# Patient Record
Sex: Male | Born: 1935 | Race: Black or African American | Hispanic: No | Marital: Single
Health system: Southern US, Community
[De-identification: ages and names within clinical notes are randomized; demographics above are authoritative.]

---

## 2005-03-30 ENCOUNTER — Emergency Department: Payer: Self-pay | Admitting: Emergency Medicine

## 2005-12-21 ENCOUNTER — Emergency Department: Payer: Self-pay | Admitting: Emergency Medicine

## 2006-11-23 ENCOUNTER — Emergency Department: Payer: Self-pay | Admitting: Emergency Medicine

## 2006-12-04 ENCOUNTER — Emergency Department: Payer: Self-pay | Admitting: Emergency Medicine

## 2007-05-21 ENCOUNTER — Emergency Department: Payer: Self-pay | Admitting: Emergency Medicine

## 2007-05-27 ENCOUNTER — Emergency Department: Payer: Self-pay | Admitting: Emergency Medicine

## 2008-09-25 ENCOUNTER — Emergency Department: Payer: Self-pay | Admitting: Emergency Medicine

## 2010-01-13 ENCOUNTER — Emergency Department: Payer: Self-pay | Admitting: Emergency Medicine

## 2010-07-02 ENCOUNTER — Emergency Department: Payer: Self-pay | Admitting: Emergency Medicine

## 2010-07-09 ENCOUNTER — Emergency Department: Payer: Self-pay | Admitting: Emergency Medicine

## 2010-12-12 ENCOUNTER — Inpatient Hospital Stay: Payer: Self-pay | Admitting: Internal Medicine

## 2010-12-13 ENCOUNTER — Ambulatory Visit: Payer: Self-pay | Admitting: Urology

## 2011-05-28 ENCOUNTER — Emergency Department: Payer: Self-pay | Admitting: Internal Medicine

## 2011-05-28 LAB — CBC
HGB: 9.8 g/dL — ABNORMAL LOW (ref 13.0–18.0)
MCH: 28.3 pg (ref 26.0–34.0)
MCHC: 32.8 g/dL (ref 32.0–36.0)
RBC: 3.45 10*6/uL — ABNORMAL LOW (ref 4.40–5.90)

## 2011-05-28 LAB — COMPREHENSIVE METABOLIC PANEL
Albumin: 3.2 g/dL — ABNORMAL LOW (ref 3.4–5.0)
Alkaline Phosphatase: 76 U/L (ref 50–136)
BUN: 45 mg/dL — ABNORMAL HIGH (ref 7–18)
Calcium, Total: 8.7 mg/dL (ref 8.5–10.1)
Co2: 21 mmol/L (ref 21–32)
EGFR (African American): 26 — ABNORMAL LOW
EGFR (Non-African Amer.): 21 — ABNORMAL LOW
Glucose: 95 mg/dL (ref 65–99)
Osmolality: 287 (ref 275–301)
SGOT(AST): <3 U/L (ref 15–37)
SGPT (ALT): 17 U/L
Sodium: 138 mmol/L (ref 136–145)

## 2011-05-28 LAB — URINALYSIS, COMPLETE
Glucose,UR: NEGATIVE mg/dL (ref 0–75)
Nitrite: NEGATIVE
Ph: 5 (ref 4.5–8.0)
WBC UR: 1796 /HPF (ref 0–5)

## 2011-06-01 LAB — URINE CULTURE

## 2011-06-03 LAB — CULTURE, BLOOD (SINGLE)

## 2011-07-04 ENCOUNTER — Emergency Department: Payer: Self-pay | Admitting: Emergency Medicine

## 2011-07-29 ENCOUNTER — Emergency Department: Payer: Self-pay | Admitting: *Deleted

## 2011-08-08 ENCOUNTER — Emergency Department: Payer: Self-pay | Admitting: Emergency Medicine

## 2011-09-24 ENCOUNTER — Emergency Department: Payer: Self-pay | Admitting: Emergency Medicine

## 2011-10-17 ENCOUNTER — Emergency Department: Payer: Self-pay | Admitting: Emergency Medicine

## 2011-10-18 LAB — URINALYSIS, COMPLETE
Bilirubin,UR: NEGATIVE
Glucose,UR: NEGATIVE mg/dL
Ketone: NEGATIVE
Nitrite: NEGATIVE
Ph: 6
Protein: 30
RBC,UR: 32 /HPF
Specific Gravity: 1.011
Squamous Epithelial: <1
WBC UR: 43 /HPF

## 2011-10-19 LAB — URINE CULTURE

## 2011-11-09 ENCOUNTER — Emergency Department: Payer: Self-pay | Admitting: Emergency Medicine

## 2011-11-09 LAB — URINALYSIS, COMPLETE
Bilirubin,UR: NEGATIVE
Bilirubin,UR: NEGATIVE
Glucose,UR: NEGATIVE mg/dL (ref 0–75)
Glucose,UR: NEGATIVE mg/dL (ref 0–75)
Nitrite: NEGATIVE
Ph: 5 (ref 4.5–8.0)
RBC,UR: 182 /HPF (ref 0–5)
RBC,UR: 388 /HPF (ref 0–5)
Squamous Epithelial: 9
Squamous Epithelial: NONE SEEN
WBC UR: 2130 /HPF (ref 0–5)

## 2011-11-09 LAB — CBC
HGB: 9.7 g/dL — ABNORMAL LOW (ref 13.0–18.0)
MCH: 27.3 pg (ref 26.0–34.0)
MCHC: 32.7 g/dL (ref 32.0–36.0)
Platelet: 290 10*3/uL (ref 150–440)
RBC: 3.54 10*6/uL — ABNORMAL LOW (ref 4.40–5.90)

## 2011-11-09 LAB — COMPREHENSIVE METABOLIC PANEL
Alkaline Phosphatase: 221 U/L — ABNORMAL HIGH (ref 50–136)
Calcium, Total: 9.4 mg/dL (ref 8.5–10.1)
Chloride: 106 mmol/L (ref 98–107)
Co2: 22 mmol/L (ref 21–32)
EGFR (African American): 23 — ABNORMAL LOW
EGFR (Non-African Amer.): 20 — ABNORMAL LOW
Potassium: 4.2 mmol/L (ref 3.5–5.1)
SGOT(AST): 54 U/L — ABNORMAL HIGH (ref 15–37)
Sodium: 137 mmol/L (ref 136–145)

## 2011-11-13 LAB — URINE CULTURE

## 2011-11-15 LAB — CULTURE, BLOOD (SINGLE)

## 2011-11-27 ENCOUNTER — Emergency Department: Payer: Self-pay | Admitting: Internal Medicine

## 2011-12-13 ENCOUNTER — Emergency Department: Payer: Self-pay | Admitting: Emergency Medicine

## 2011-12-13 LAB — PROTIME-INR
INR: 1
Prothrombin Time: 13.8 secs (ref 11.5–14.7)

## 2011-12-13 LAB — CBC
HCT: 28.7 % — ABNORMAL LOW (ref 40.0–52.0)
HGB: 9.3 g/dL — ABNORMAL LOW (ref 13.0–18.0)
MCH: 28 pg (ref 26.0–34.0)
MCHC: 32.3 g/dL (ref 32.0–36.0)
MCV: 87 fL (ref 80–100)
RBC: 3.32 10*6/uL — ABNORMAL LOW (ref 4.40–5.90)
RDW: 20.9 % — ABNORMAL HIGH (ref 11.5–14.5)

## 2011-12-13 LAB — COMPREHENSIVE METABOLIC PANEL
Albumin: 3.7 g/dL (ref 3.4–5.0)
Anion Gap: 11 (ref 7–16)
Calcium, Total: 9.3 mg/dL (ref 8.5–10.1)
Chloride: 108 mmol/L — ABNORMAL HIGH (ref 98–107)
Co2: 20 mmol/L — ABNORMAL LOW (ref 21–32)
EGFR (African American): 23 — ABNORMAL LOW
EGFR (Non-African Amer.): 20 — ABNORMAL LOW
Glucose: 112 mg/dL — ABNORMAL HIGH (ref 65–99)
Osmolality: 292 (ref 275–301)
Potassium: 4.9 mmol/L (ref 3.5–5.1)
SGPT (ALT): 20 U/L (ref 12–78)
Sodium: 139 mmol/L (ref 136–145)
Total Protein: 7.8 g/dL (ref 6.4–8.2)

## 2011-12-13 LAB — TROPONIN I: Troponin-I: <0.02 ng/mL

## 2011-12-14 LAB — URINALYSIS, COMPLETE
Bilirubin,UR: NEGATIVE
Glucose,UR: NEGATIVE mg/dL (ref 0–75)
Nitrite: NEGATIVE
Protein: 30
RBC,UR: 76 /HPF (ref 0–5)
Specific Gravity: 1.016 (ref 1.003–1.030)
Squamous Epithelial: 1

## 2011-12-14 LAB — TROPONIN I: Troponin-I: <0.02 ng/mL

## 2011-12-24 LAB — TROPONIN I
Troponin-I: <0.02 ng/mL
Troponin-I: <0.02 ng/mL
Troponin-I: <0.02 ng/mL

## 2011-12-24 LAB — COMPREHENSIVE METABOLIC PANEL
Albumin: 3.7 g/dL (ref 3.4–5.0)
Alkaline Phosphatase: 313 U/L — ABNORMAL HIGH (ref 50–136)
Bilirubin,Total: 0.3 mg/dL (ref 0.2–1.0)
Chloride: 107 mmol/L (ref 98–107)
Co2: 15 mmol/L — ABNORMAL LOW (ref 21–32)
Creatinine: 2.79 mg/dL — ABNORMAL HIGH (ref 0.60–1.30)
EGFR (African American): 24 — ABNORMAL LOW
EGFR (Non-African Amer.): 21 — ABNORMAL LOW
Osmolality: 288 (ref 275–301)
SGPT (ALT): 19 U/L (ref 12–78)
Sodium: 138 mmol/L (ref 136–145)

## 2011-12-24 LAB — CBC
MCH: 28.3 pg (ref 26.0–34.0)
MCHC: 32.9 g/dL (ref 32.0–36.0)
MCV: 86 fL (ref 80–100)
Platelet: 272 10*3/uL (ref 150–440)
RBC: 3.28 10*6/uL — ABNORMAL LOW (ref 4.40–5.90)
RDW: 20.8 % — ABNORMAL HIGH (ref 11.5–14.5)

## 2011-12-24 LAB — CK TOTAL AND CKMB (NOT AT ARMC)
CK, Total: 97 U/L (ref 35–232)
CK, Total: 258 U/L — ABNORMAL HIGH (ref 35–232)
CK, Total: 270 U/L — ABNORMAL HIGH (ref 35–232)
CK-MB: 0.7 ng/mL (ref 0.5–3.6)
CK-MB: 0.6 ng/mL (ref 0.5–3.6)

## 2011-12-24 LAB — APTT: Activated PTT: 68.8 secs — ABNORMAL HIGH (ref 23.6–35.9)

## 2011-12-24 LAB — PROTIME-INR
INR: 1
Prothrombin Time: 13.7 secs (ref 11.5–14.7)

## 2011-12-25 LAB — CBC WITH DIFFERENTIAL/PLATELET
Basophil %: 0.2 %
Eosinophil #: 0.1 10*3/uL (ref 0.0–0.7)
Eosinophil %: 1 %
HCT: 28.3 % — ABNORMAL LOW (ref 40.0–52.0)
HGB: 9.4 g/dL — ABNORMAL LOW (ref 13.0–18.0)
Lymphocyte %: 11.2 %
MCHC: 33.2 g/dL (ref 32.0–36.0)
Monocyte #: 0.8 x10 3/mm (ref 0.2–1.0)
Neutrophil #: 5.9 10*3/uL (ref 1.4–6.5)
Platelet: 260 10*3/uL (ref 150–440)
RBC: 3.28 10*6/uL — ABNORMAL LOW (ref 4.40–5.90)

## 2011-12-25 LAB — FIBRINOGEN: Fibrinogen: 500 mg/dL — ABNORMAL HIGH (ref 210–470)

## 2011-12-25 LAB — COMPREHENSIVE METABOLIC PANEL
Albumin: 3.3 g/dL — ABNORMAL LOW (ref 3.4–5.0)
Alkaline Phosphatase: 371 U/L — ABNORMAL HIGH (ref 50–136)
Calcium, Total: 9 mg/dL (ref 8.5–10.1)
Chloride: 106 mmol/L (ref 98–107)
Co2: 20 mmol/L — ABNORMAL LOW (ref 21–32)
EGFR (African American): 25 — ABNORMAL LOW
EGFR (Non-African Amer.): 22 — ABNORMAL LOW
Osmolality: 285 (ref 275–301)
Potassium: 4.9 mmol/L (ref 3.5–5.1)
SGPT (ALT): 17 U/L (ref 12–78)
Total Protein: 7.2 g/dL (ref 6.4–8.2)

## 2011-12-25 LAB — PROTIME-INR
INR: 1.1
Prothrombin Time: 14.1 secs (ref 11.5–14.7)

## 2011-12-25 LAB — LIPID PANEL
HDL Cholesterol: 49 mg/dL (ref 40–60)
Triglycerides: 77 mg/dL (ref 0–200)
VLDL Cholesterol, Calc: 15 mg/dL (ref 5–40)

## 2011-12-25 LAB — AMYLASE: Amylase: 66 U/L (ref 25–115)

## 2011-12-25 LAB — MAGNESIUM: Magnesium: 1.9 mg/dL

## 2011-12-25 LAB — APTT: Activated PTT: 63.8 secs — ABNORMAL HIGH (ref 23.6–35.9)

## 2011-12-26 ENCOUNTER — Inpatient Hospital Stay: Payer: Self-pay | Admitting: Internal Medicine

## 2011-12-26 ENCOUNTER — Ambulatory Visit: Payer: Self-pay | Admitting: Internal Medicine

## 2011-12-26 LAB — COMPREHENSIVE METABOLIC PANEL
Anion Gap: 9 (ref 7–16)
Chloride: 107 mmol/L (ref 98–107)
Co2: 21 mmol/L (ref 21–32)
Creatinine: 2.31 mg/dL — ABNORMAL HIGH (ref 0.60–1.30)
EGFR (African American): 30 — ABNORMAL LOW
EGFR (Non-African Amer.): 26 — ABNORMAL LOW
Glucose: 94 mg/dL (ref 65–99)
Osmolality: 287 (ref 275–301)
Potassium: 4.8 mmol/L (ref 3.5–5.1)
SGOT(AST): 43 U/L — ABNORMAL HIGH (ref 15–37)
SGPT (ALT): 16 U/L (ref 12–78)
Sodium: 137 mmol/L (ref 136–145)
Total Protein: 6.7 g/dL (ref 6.4–8.2)

## 2011-12-26 LAB — CBC WITH DIFFERENTIAL/PLATELET
HCT: 26.4 % — ABNORMAL LOW (ref 40.0–52.0)
HGB: 8.7 g/dL — ABNORMAL LOW (ref 13.0–18.0)
Lymphocytes: 10 %
MCHC: 32.8 g/dL (ref 32.0–36.0)
MCV: 86 fL (ref 80–100)
RBC: 3.08 10*6/uL — ABNORMAL LOW (ref 4.40–5.90)
Segmented Neutrophils: 75 %

## 2011-12-26 LAB — APTT: Activated PTT: 34.9 secs (ref 23.6–35.9)

## 2011-12-27 LAB — CBC WITH DIFFERENTIAL/PLATELET
Basophil %: 0.1 %
Eosinophil #: 0.1 10*3/uL (ref 0.0–0.7)
Eosinophil %: 0.7 %
HGB: 8.4 g/dL — ABNORMAL LOW (ref 13.0–18.0)
Lymphocyte #: 1 10*3/uL (ref 1.0–3.6)
Lymphocyte %: 10.7 %
MCHC: 32.9 g/dL (ref 32.0–36.0)
Monocyte #: 1.2 x10 3/mm — ABNORMAL HIGH (ref 0.2–1.0)
Monocyte %: 13.5 %
Neutrophil #: 6.7 10*3/uL — ABNORMAL HIGH (ref 1.4–6.5)
Neutrophil %: 75 %
RDW: 20.6 % — ABNORMAL HIGH (ref 11.5–14.5)

## 2011-12-27 LAB — COMPREHENSIVE METABOLIC PANEL
Albumin: 2.7 g/dL — ABNORMAL LOW (ref 3.4–5.0)
Alkaline Phosphatase: 257 U/L — ABNORMAL HIGH (ref 50–136)
Bilirubin,Total: 0.4 mg/dL (ref 0.2–1.0)
Calcium, Total: 8.6 mg/dL (ref 8.5–10.1)
Chloride: 109 mmol/L — ABNORMAL HIGH (ref 98–107)
Co2: 19 mmol/L — ABNORMAL LOW (ref 21–32)
Creatinine: 2.11 mg/dL — ABNORMAL HIGH (ref 0.60–1.30)
EGFR (African American): 33 — ABNORMAL LOW
Glucose: 103 mg/dL — ABNORMAL HIGH (ref 65–99)
SGOT(AST): 54 U/L — ABNORMAL HIGH (ref 15–37)
SGPT (ALT): 31 U/L (ref 12–78)
Sodium: 139 mmol/L (ref 136–145)

## 2011-12-31 LAB — PATHOLOGY REPORT

## 2012-01-07 ENCOUNTER — Ambulatory Visit: Payer: Self-pay | Admitting: Internal Medicine

## 2012-03-08 DEATH — deceased

## 2013-03-09 IMAGING — US ABDOMEN ULTRASOUND LIMITED
1 series · 14 of 25 positions shown · non-contrast
Comparison: none

REASON FOR EXAM: clinically acute cholecystitis
COMMENTS:   Body Site: GB and Fossa, CBD, Head of Pancreas

[Series 1: abdomen ultrasound limited · 0.23mm/px · 14 of 40 slices shown]
[im 1/40]
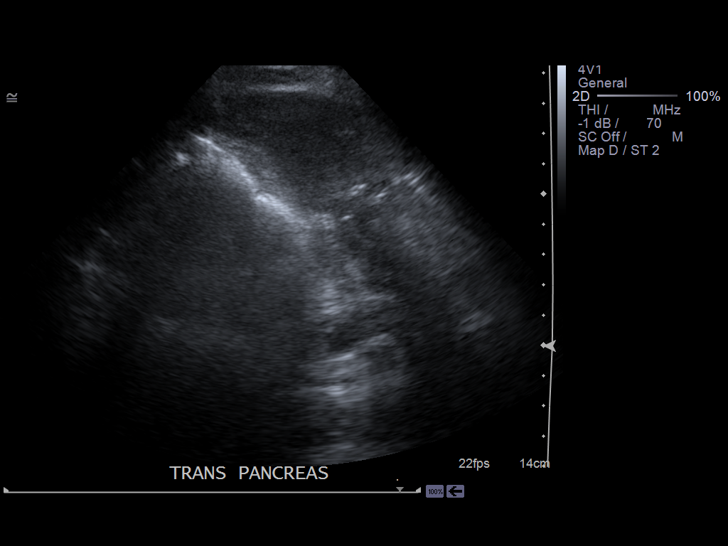
[im 4/40]
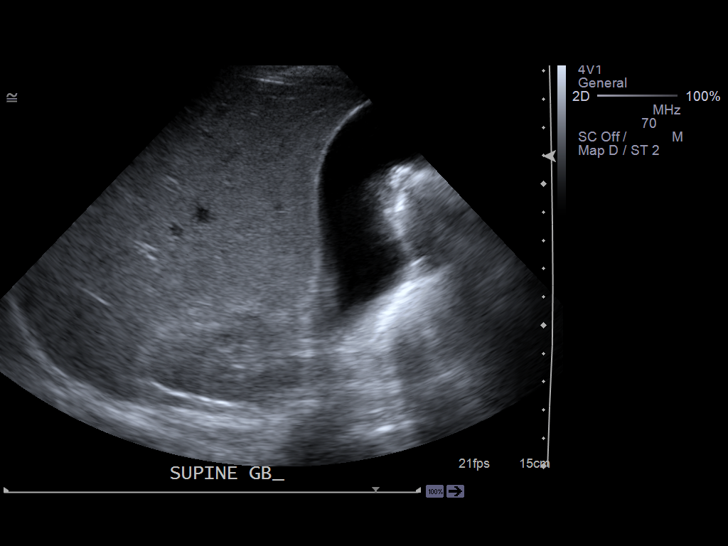
[im 7/40]
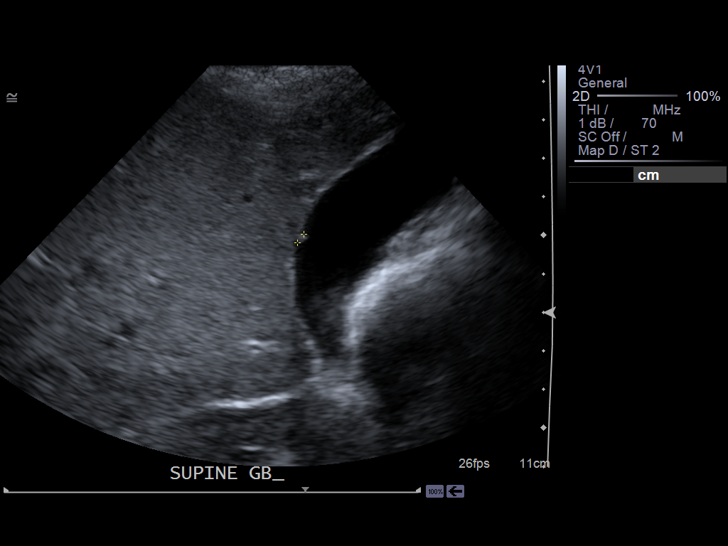
[im 10/40]
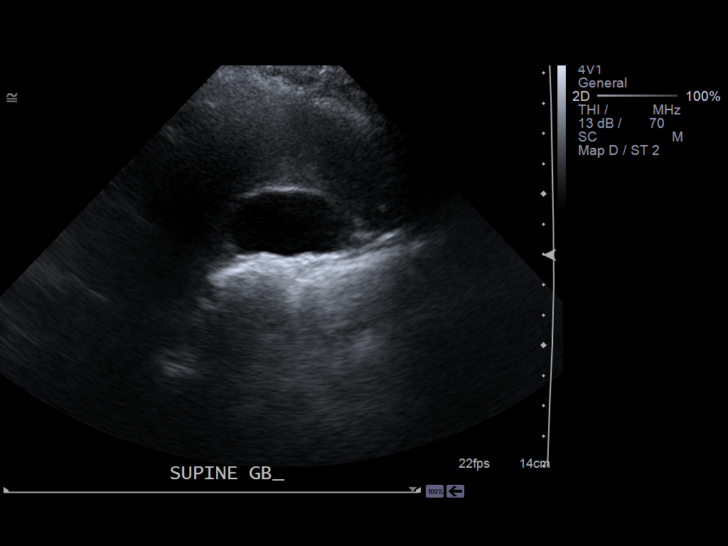
[im 14/40]
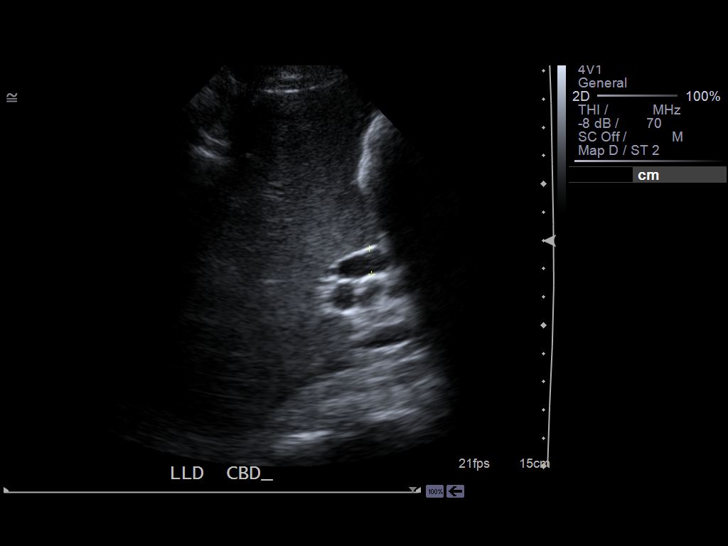
[im 15/40]
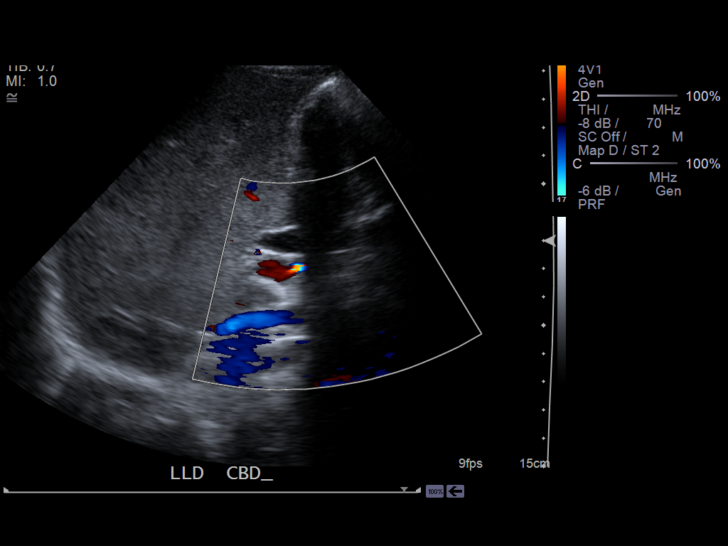
[im 18/40]
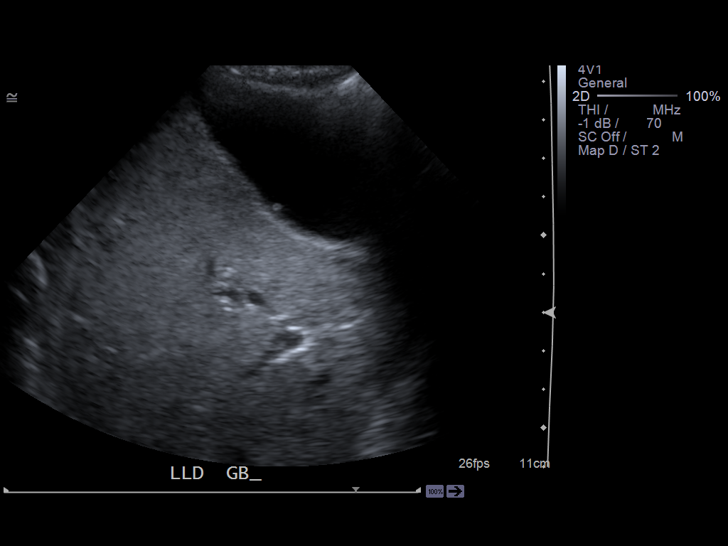
[im 22/40]
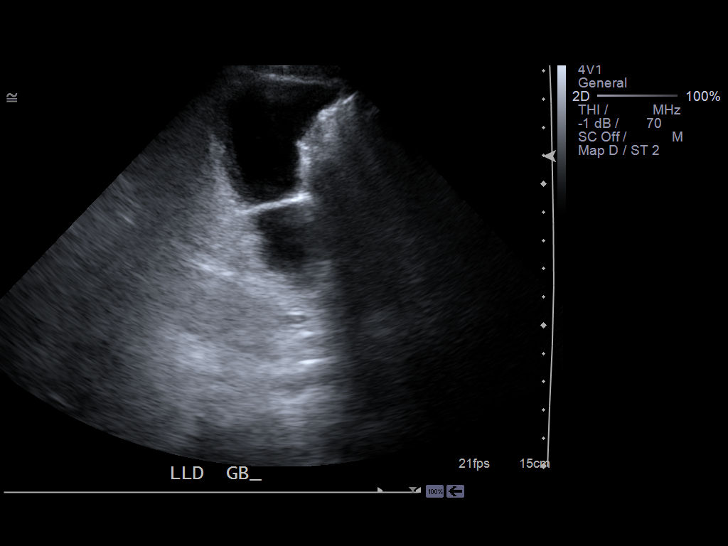
[im 25/40]
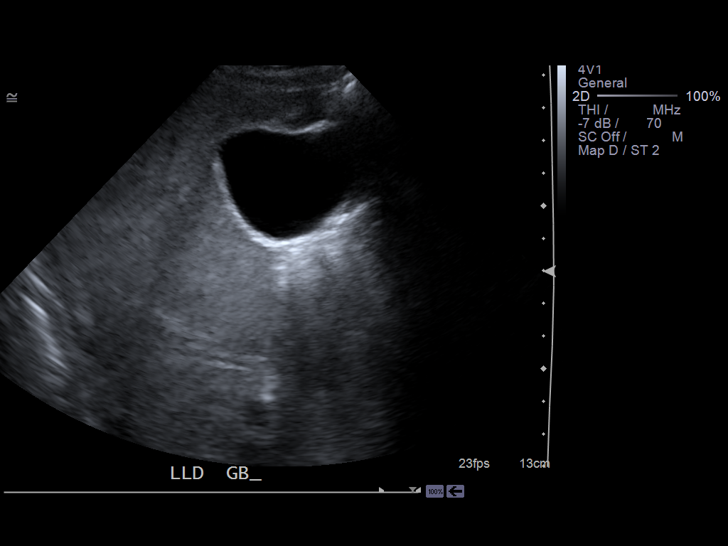
[im 27/40]
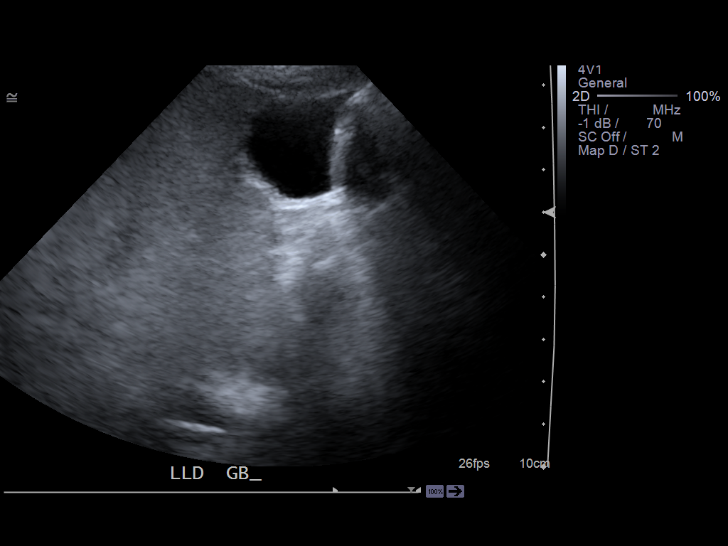
[im 30/40]
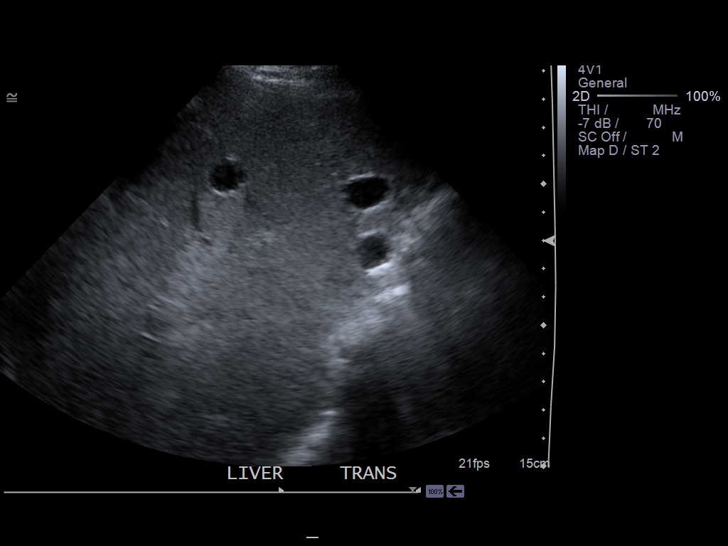
[im 33/40]
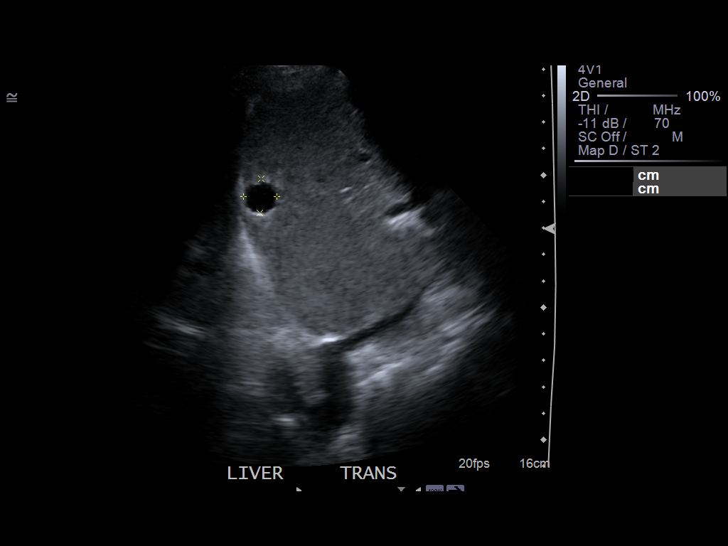
[im 36/40]
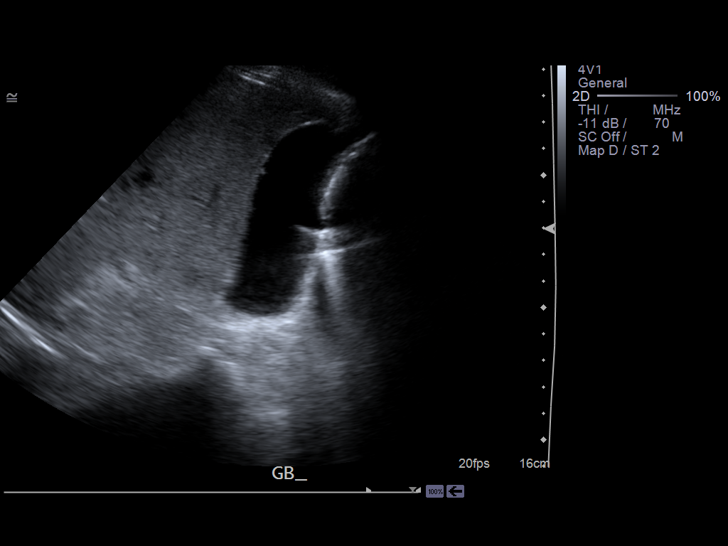
[im 40/40]
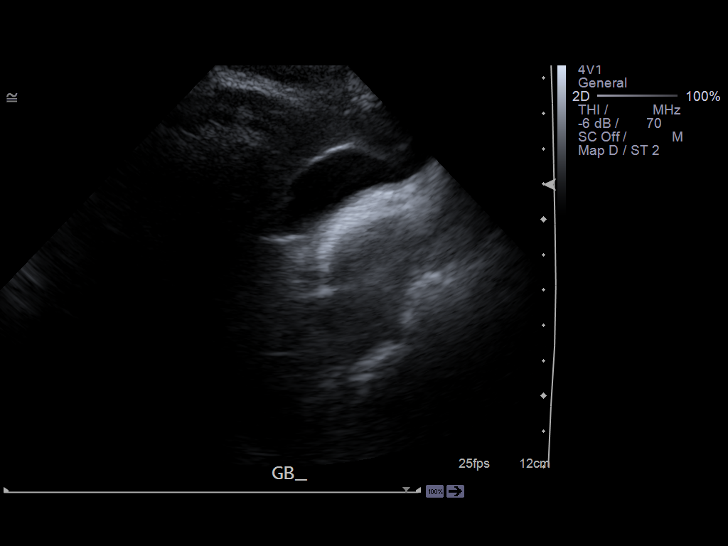

[14 of 25 positions shown; findings below may reference images not displayed]

PROCEDURE:     US  - US ABDOMEN LIMITED SURVEY  - December 25, 2011  [DATE]

RESULT:     A limited right upper quadrant ultrasound exam was performed.

The gallbladder is adequately distended. It contains an echogenic nonmobile
focus compatible with a polyp measuring just under 3 mm in diameter. There
is no gallbladder wall thickening or pericholecystic fluid or positive
sonographic Murphy's sign. The common bile duct is mildly dilated at 9.1 mm
in diameter. The observed portions of the liver exhibit multiple simple
appearing cyst. The pancreatic head could not be adequately demonstrated due
to bowel gas.
IMPRESSION: 1. No gallstones are identified and there is no sonographic evidence of
acute cholecystitis
2. A nonshadowing stone versus polyp is present in the gallbladder.
3. The common bile duct is dilated at 9 mm in diameter.
4. The observed portions of the liver exhibit multiple cysts with the
largest measuring 1.6 cm in diameter.

[REDACTED]

## 2014-06-25 NOTE — Consult Note (Signed)
PATIENT NAME:  Russell Griffin, Russell Griffin MR#:  [id number] DATE OF BIRTH:  26-Jun-1930  DATE OF CONSULTATION:  12/25/2011  REFERRING PHYSICIAN:   CONSULTING PHYSICIAN:  Claude Manges, MD  HISTORY OF PRESENT ILLNESS: Mr. Mendiola is a 81 year old black male who was admitted to the hospital yesterday for what was thought to be pleuritic chest pain, tachycardia, and systolic hypertension. He underwent V/Q scan which was low probability for pulmonary embolism. He also had bilateral lower extremity duplex which was negative for clots. Thus, his heparin drip was just discontinued today. He was noted to vomit his breakfast and also noted to have right upper quadrant pain in addition to his bilateral chest pain and elevated hepatic transaminases and was thought to possibly have acute cholecystitis. Therefore, I was consulted.   On further history, the patient vomited about three days ago, had his last bowel movement three days ago, was not nauseous prior to vomiting his breakfast this morning, and has had right upper quadrant pain for three days that has worsened over that time. He denies fever and during this hospitalization he has remained afebrile. He also says that he has never experienced anything like this before. There has been no change in the color of his eyes or his urine.   PAST MEDICAL HISTORY:  1. Bilateral renal obstruction due to hormone refractory cancer of the prostate, status post external beam radiation and chemotherapy (currently being treated at [facility]). 2. Bilateral nephrostomy tubes.  3. Hypertension.  4. Chronic renal insufficiency.  5. Chronic anemia.  6. History of shingles.  7. Chronic obstructive pulmonary disease.  8. Recurrent urinary tract infections.  MEDICATIONS AT HOME:  1. Prednisone 5 mg daily.  2. Meloxicam 15 mg daily.  3. Percocet 5/325 one q.4 hours p.r.n.  4. Procardia XL 60 mg daily.  5. Zytiga 250 mg 4 tabs daily.   ALLERGIES: OxyContin.   FAMILY  HISTORY: Noncontributory.   SOCIAL HISTORY: The patient lives in his home and his granddaughter (who takes care of him) lives with him as well. He is widowed. He quit smoking and quit drinking alcohol about four years ago. He is a retired Midwife.   REVIEW OF SYSTEMS: Negative for 10 systems except as mentioned in the history of present illness above. Specifically, the patient denies fever, change in the color of his eyes or urine.   PHYSICAL EXAMINATION:   VITAL SIGNS: Temperature 98.3, pulse 106, respirations 18, blood pressure 153/95, oxygen saturation 98% on room air at rest. Height 6 feet 2 inches, weight 181 pounds, BMI 23.3.   HEENT: Pupils equally round and reactive to light. Extraocular movements intact. Sclerae nonicteric. Oropharynx clear. Mucous membranes moist. Hearing intact to voice.   NECK: Supple with no tracheal deviation or jugular venous distention.   HEART: Regular rate and rhythm with no murmurs or rubs.   LUNGS: Clear to auscultation with normal respiratory effort bilaterally.   ABDOMEN: Bilateral nephrostomy tubes with cloudy urine in the urine bag. No scars. Flat and fairly rigid with most significant tenderness in the right upper quadrant. It is difficult to ascertain rebound tenderness or guarding due to his generalized abdominal tension.   EXTREMITIES: No edema with normal capillary refill bilaterally.   NEUROLOGIC: Cranial nerves II through XII, motor and sensation grossly intact.   PSYCHIATRIC: Alert and oriented x4 with appropriate affect.   LABORATORY, DIAGNOSTIC, AND RADIOLOGICAL DATA: Creatinine 2.7, BUN 51. Electrolytes normal. Magnesium normal. Alkaline phosphatase elevated at 371. SGOT elevated at 78.  Bilirubin normal at 0.3. Amylase and lipase have not been checked. White blood cell count 7.7, hemoglobin 9.4, hematocrit 28%, platelet count 260,000. PT 14. INR 1.1. PTT (at 5 a.m. on heparin) 64.   ASSESSMENT: Likely acute cholecystitis.   PLAN:  Obtain ultrasound today and if diagnosis is confirmed perform laparoscopic cholecystectomy tomorrow after coags have been rechecked off of his heparin drip for over six hours. I have expanded his antibiotic coverage and also given him a bottle of mag citrate to drink today for his constipation.   ____________________________ Claude MangesWilliam F. Ianmichael Amescua, MD wfm:drc D: 12/25/2011 12:52:56 ET T: 12/25/2011 13:12:56 ET JOB#: [id number]  cc: Claude MangesWilliam F. Odalys Win, MD, <Dictator> Bobbye RiggsSylvia Mand, FNP Claude MangesWILLIAM F Anadalay Macdonell MD ELECTRONICALLY SIGNED 12/26/2011 16:53

## 2014-06-25 NOTE — Consult Note (Signed)
PATIENT NAME:  Russell Griffin, Russell Griffin MR#:  [id number] DATE OF BIRTH:  1930/09/26  DATE OF CONSULTATION:  12/27/2011  REFERRING PHYSICIAN:  Dr. Winona Legato  CONSULTING PHYSICIAN:  Rashee Marschall R. Sherrlyn Hock, MD  REASON FOR CONSULTATION: Patient admitted with chest pain, also had right upper quadrant pain, underwent lap cholecystectomy today, gallbladder unremarkable and pain was felt from metastatic bone disease from prostate cancer.   HISTORY OF PRESENT ILLNESS: The patient is a 81 year old African American gentleman who was admitted to the hospital on October 18th with complaints of left-sided chest pain. The patient states that he had been feeling fairly well up until sudden onset of chest pain. He has undergone work-up for this and V/Q scan final report was low probability. D-dimer was slightly elevated. The patient also has renal insufficiency, creatinine 2.79, mild anemia. He was also felt to have gallbladder etiology for pain and had laparoscopic cholecystectomy earlier today. He states that his left-sided chest pain is better. The patient also has known history of metastatic prostate cancer to the bones for which he takes treatment at [facility], details not available but from limited available data it appears that he has hormone refractory (castrate resistant) prostate cancer and is on treatment with oral Zytiga along with prednisone. The patient states that he visits [facility] once every few months for follow-up, but last time he was told that treatment is working, does not remember last PSA level or sites of metastatic disease in the bones. Appetite is fair.   PAST MEDICAL HISTORY/PAST SURGICAL HISTORY:  1. Metastatic prostate cancer as above.  2. Hypertension. 3. Chronic kidney disease.  4. Chronic anemia.  5. History of shingles.  6. Chronic obstructive pulmonary disease.  7. Recurrent UTIs.  8. Bilateral ureteral stent and nephrostomy tubes.   FAMILY HISTORY: Remarkable for heart disease, hypertension,  stroke, diabetes.   SOCIAL HISTORY: The patient lives at home. Quit smoking two years ago. Denies alcohol usage.   ALLERGIES: OxyContin causes nausea and vomiting.   HOME MEDICATIONS:  1. Prednisone 5 mg daily.  2. Zytiga 250 mg 4 tablets daily.  3. Meloxicam 15 mg daily.  4. Percocet 5/325 one tablet q.4 hours p.r.n.  5. Procardia XL 60 mg daily.   REVIEW OF SYSTEMS: CONSTITUTIONAL: Currently feeling tired after surgery. Otherwise, denies fevers or chills. No night sweats. HEENT: Denies headaches, dizziness, epistaxis, ear or jaw pain. CARDIAC: Recent left-sided chest pain, currently better. No orthopnea, PND, or palpitations. LUNGS: Denies any new dyspnea on exertion, cough, sputum, or hemoptysis. GI: Denies any current nausea, vomiting, or diarrhea. No bright red blood in stools or melena. GU: No dysuria or hematuria. SKIN: No new rashes or pruritus. HEMATOLOGIC: Denies obvious bleeding symptoms. NEUROLOGIC: No new focal weakness, seizures, or loss of consciousness. EXTREMITIES: Denies any new swelling or pain. ENDOCRINE: No polyuria or polydipsia.   PHYSICAL EXAMINATION:   GENERAL: The patient is a moderately built and nourished individual, resting in bed, alert and oriented in no acute distress. Converses appropriately. No icterus. Mild pallor.   VITAL SIGNS: Temperature 98.1, pulse 106, respirations 18, blood pressure 114/71, 91% on room air.   HEENT: Normocephalic, atraumatic. Extraocular movements intact. Sclerae anicteric. No oral thrush.   NECK: Negative for lymphadenopathy.   CARDIOVASCULAR: S1 and S2, regular rate and rhythm.   LUNGS: Lungs show bilateral good air entry, decreased at bases, no rhonchi.   ABDOMEN: Soft, tenderness in right upper quadrant. Just had laparoscopic cholecystectomy today. Bowel sounds present.   EXTREMITIES: No major edema  or cyanosis.   SKIN: No generalized rashes or major bruising.   NEUROLOGIC: Limited examination. Cranial nerves intact,  moves all extremities spontaneously.   MUSCULOSKELETAL: No obvious joint deformity or swelling.   LABORATORY, DIAGNOSTIC, AND RADIOLOGICAL DATA: BUN 49, creatinine 2.31, potassium 4.8, calcium 3. Liver functions unremarkable except alkaline phosphatase of 313, albumin of 3.1. Hemoglobin 8.7, platelets 241, WBC 8000 with 75% neutrophils. PTT 34.9. Amylase 66. Lipase 125. Fibrinogen 500. INR 1.1.   V/Q scan final report overall probability of pulmonary embolism is low. Bilateral lower extremity Doppler negative for DVT.   IMPRESSION AND RECOMMENDATIONS: This is a 81 year old gentleman with history of advanced gastric resistant prostate cancer as described above. The patient has been diagnosed and [facility] so far, currently on Zytiga and prednisone. Admitted with left-sided chest pain which seems to have improved. He also had laparoscopic cholecystectomy done earlier today. Gallbladder intraoperatively seemed unremarkable. CT scan of chest without contrast on October 8th otherwise reports findings concerning for skeletal blastic metastatic disease. The patient states that he has been known to have bone metastasis from prostate cancer, unsure if this indicates progression or prior findings. At this time recommend getting a repeat PSA and will [facility] to [facility] to prior value to see if it is rising. If PSA is under control, then would recommend resuming Zytiga and prednisone once he is able to take p.o. postoperatively, and get him back [facility] since the patient wants to continue prostate cancer management and follow-up done over there. He currently does not have acute pain issues. Continue Norco along with IV morphine p.r.n. as needed for pain control. Will continue to follow once PSA report is available.   Thank you for the referral. Please feel free to contact me if any additional questions.   ____________________________ Maren ReamerSandeep R.  Sherrlyn HockPandit, MD srp:drc D: 12/27/2011 12:38:00 ET T: 12/27/2011 12:47:17 ET JOB#: [id number] Darryll CapersSANDEEP R Dorcus Riga MD ELECTRONICALLY SIGNED 12/28/2011 17:16

## 2014-06-25 NOTE — Op Note (Signed)
PATIENT NAME:  Russell Griffin, Russell Griffin MR#:  [id number] DATE OF BIRTH:  10-May-1930  DATE OF PROCEDURE:  12/26/2011  PREOPERATIVE DIAGNOSIS: Gallbladder polyp.  POSTOPERATIVE DIAGNOSIS: Gallbladder polyp.  OPERATION PERFORMED: Laparoscopic cholecystectomy.   SURGEON: Claude MangesWilliam F. Josanna Hefel, MD   ANESTHESIA: General.   PROCEDURE IN DETAIL: The patient was placed supine on the Operating Room table and prepped and draped in the usual sterile fashion. A 15 mmHg CO2 pneumoperitoneum was created via a Veress needle in the infraumbilical position, and this was replaced with a 5 mm trocar and a 30-degree angled scope. The remaining trocars were placed under direct visualization. The patient was noted to have a gaseous distended right colon and proximal transverse colon as well as diverticulosis with a hard feculent stool ball in the proximal transverse colon. There were also multiple small liver cysts. There were multiple adhesions to the visceral surface of the gallbladder and these all appeared chronic. They were taken down with a combination of the electrocautery and bluntly, and the fundus was retracted superiorly and ventrally. The infundibulum was retracted laterally opening up the space in the triangle of Calot. The distances within the triangle were quite long. The common bile duct could be clearly visualized, and there was a long cystic artery and a long cystic duct. The cystic duct was triply clipped and divided. The cystic artery was doubly clipped and divided. The gallbladder was removed from the liver bed with the electrocautery, placed in an EndoCatch bag and extracted from the abdomen via the epigastric port. This port site fascia was closed with a 0 Vicryl using the laparoscopic puncture closure device. The liver bed was cauterized and hemostasis was excellent. There was no evidence of bile staining, and therefore the peritoneum was desufflated and decannulated after irrigation with a small amount of warm  normal saline which was suctioned out, and all four skin sites were closed with subcuticular 5-0 Monocryl and suture strips.    After the procedure was concluded, the gallbladder was opened and seen to contain a single cholesterol polyp but no cholesterolosis, no gallstones, and there was certainly no evidence of acute cholecystitis.  ____________________________ Claude MangesWilliam F. Janie Strothman, MD wfm:cbb D: 12/26/2011 09:37:32 ET Griffin: 12/26/2011 14:02:51 ET JOB#: [id number]  cc: Claude MangesWilliam F. Meriel Kelliher, MD, <Dictator> Claude MangesWILLIAM F Alessander Sikorski MD ELECTRONICALLY SIGNED 12/26/2011 16:53

## 2014-06-25 NOTE — Discharge Summary (Signed)
PATIENT NAME:  Russell Griffin, Russell Griffin MR#:  [id number] DATE OF BIRTH:  1930-04-02  DATE OF ADMISSION:  12/24/2011 DATE OF DISCHARGE:  12/27/2011  ADMITTING DIAGNOSIS: Right chest pain.   DISCHARGE DIAGNOSES:  1. Right-sided pleuritic chest pain, likely biliary colic, status post laparoscopic cholecystectomy on 12/26/2011 by Dr. Anda KraftMarterre.  2. Elevated transaminases, stable.  3. History of prostate carcinoma, metastatic bone metastasis.  4. Chronic kidney disease, stage IV.  5. Hypertension.  6. Anemia.  7. Chronic obstructive pulmonary disease.  8. Bilateral ureteral stents and nephrostomy tubes. 9. History of frequent urinary tract infections.   DISCHARGE CONDITION: Stable.   DISCHARGE MEDICATIONS: The patient is to resume his outpatient medications which are:  1. Procardia XL 60 mg p.o. daily.  2. Zytiga 250 mg, 4 tablets once daily.  3. Prednisone 5 mg p.o. daily.  4. Meloxicam 15 mg p.o. daily.  5. Acetaminophen/hydrocodone 325 mg/7.5 mg, 1 tablet every 4 hours as needed.  6. Pantoprazole 40 mg p.o. daily.   HOME OXYGEN: None.   DIET: 2 grams salt, low fat, low cholesterol.   ACTIVITY LIMITATIONS: As tolerated.     FOLLOWUP:  1. [facility]up appointment with [facility] in 2days after discharge.  2. Follow-up appointment with Dr. Anda KraftMarterre in 2 days after discharge.  3. [facility] with [facility] in 2 days after discharge.    CONSULTANTS:  1. Dr. Anda KraftMarterre.  2. Dr. Sherrlyn HockPandit.  LABORATORY, DIAGNOSTIC AND RADIOLOGICAL DATA: Chest x-ray, portable single view on 12/24/2011 showed no acute cardiopulmonary disease. Lung V/Q scan on 12/24/2011 showed abnormal ventilation-perfusion lung scan, matched defects in both lower lobes, more prominent on the left than on the right. The overall probability of pulmonary embolism is low. This does not exclude pulmonary embolism as a diagnostic consideration. Ultrasound of abdomen, limited survey on 12/25/2011,  showed no gallstones, no sonographic evidence of acute cholecystitis. Non shadowing stone versus polyp was present in the gallbladder. The common bile duct was dilated at 9 mm in diameter. Observed portions of the liver exhibited multiple cysts with the largest measuring 1.6 cm in diameter. Ultrasound of bilateral lower extremities 12/24/2011 showed no evidence of deep vein thrombosis in either lower extremity. Bone scan on 12/27/2011, whole body, revealed findings consistent with extensive metastatic disease involving the appendicular and axial skeleton as described above until proven otherwise. Findings also concerning for renal insufficiency or possibly failure on the left. Lab data on admission revealed elevated BUN and creatinine of 47 and 2.79, glucose was 107, otherwise BMP was unremarkable. The patient's liver enzymes showed elevated AST to 39 and elevated alkaline phosphatase to 313. Cardiac enzymes x3 did not show any cardiac injury. White blood cell count was normal at 6.8, hemoglobin was 9.3, platelet count 272. Coagulation panel was unremarkable with pro time 13.7, INR 1.0, activated PTT was 26.8 which was normal, and D-dimer was elevated at 1.58. EKG showed sinus tachycardia at 113 beats per minute, cannot rule out anterior infarct according to EKG criteria which was, however, cited in May 2012. Abnormal EKG but no significant change since prior EKG was found.   HISTORY AND PHYSICAL: The patient is 81 year old African American male with past medical history significant for history of prostate carcinoma who presented to the hospital complaints of right-sided chest pain. Please refer to Dr. Jackqulyn LivingsAkuneme's admission note on 12/24/2011. On arrival to the hospital, the patient's temperature was 98.7, pulse was 114, respiration rate was 20, blood pressure 170/93, saturation was 100% on  room air.   Physical exam was remarkable for discomfort on palpation in the right upper quadrant of abdomen but not on  admission. Physical exam on admission was unremarkable.   HOSPITAL COURSE:  The patient was admitted to the hospital. His cardiac enzymes were cycled, and the V/Q scan was ordered to rule out pulmonary embolism. Unfortunately, the patient's VQ scan was unremarkable and not informative. The patient had ultrasound of his lower extremities, but that showed no deep vein thrombosis as well. The next day on 12/25/2011, he was noted to have right upper quadrant abdominal pain; and because of concerns of possible acute cholecystitis, ultrasound of abdomen was performed as well as consultation with Dr. Anda Kraft was obtained.  Dr. Anda Kraft also felt that the patient could have acute cholecystitis and took him to the Operating Room. The patient underwent laparoscopic cholecystectomy on 12/26/2011. Postoperatively, he felt much more comfortable, did not have any significant discomfort in his abdomen unless, of course, the abdomen was touched. It was felt that, however, as the patient's gallbladder itself was unremarkable the patient very unlikely had cholecystitis but probably passed a stone. Postoperatively, the patient had his liver enzymes checked again which were stable. On the day of discharge, 12/27/2011, he felt satisfactory and did not complain of any significant discomfort and his labs were stable. It was felt that the patient's right-sided pleuritic chest pain was very likely related to biliary colic. The patient underwent, as mentioned above, laparoscopic cholecystectomy on 12/26/2011; however, no cholecystitis was noted during the operation, however, it was felt that the patient could have passed a stone and that is why he was having significant pains. His elevated transaminase should be rechecked as outpatient to ensure its stability and improvement.   In regards to prostate carcinoma, metastatic cancer, consultation with Dr. Sherrlyn Hock was obtained; however, Dr. Sherrlyn Hock felt that it is recommended to check the  patient's PSA and obtain records from [facility] to compare if this value is rising. If the patient's PSA is under control done, then he recommended to resume Zytiga and prednisone and return back to [facility] since the patient wanted to continue his prostate cancer management and follow-up done over there. The patient had prostate specific antigen checked; however, those results are not available at the time of dictation. It was felt that the patient should continue followup with [facility]. He is to resume his outpatient medications, and he is to follow up with [facility]. He is to continue Norco for pain control.   The patient's vital signs on the day of discharge: Temperature is 98.9, pulse 106, respiratory rate 19, blood pressure 120/66, saturation 93% on room air at rest up to 97% on room air at rest.   The patient is being discharged home with Home Health Nursing to change dressings around the nephrostomy tube every 3 days as well as medication management. The patient also is going to get Home Health physical therapy due to weakness.  TIME SPENT:   40 minutes.   ____________________________ Katharina Caper, MD rv:cbb D: 12/27/2011 17:31:31 ET Griffin: 12/28/2011 11:13:16 ET JOB#: [id number]  cc: Katharina Caper, MD, <Dictator> [facility] Claude Manges, MD Nanna Ertle Winona Legato MD ELECTRONICALLY SIGNED 01/21/2012 12:29

## 2014-06-25 NOTE — Op Note (Addendum)
PATIENT NAME:  Russell Griffin, Russell Griffin MR#:  [id number] DATE OF BIRTH:  08-04-1930  DATE OF PROCEDURE:  12/13/2010  PREOPERATIVE DIAGNOSES:  1. Left ureterovesical junction stone with obstruction.  2. Urinary tract infection/probable pyonephrosis.  POSTOPERATIVE DIAGNOSES: 1. Left ureterovesical junction stone with obstruction.  2. Urinary tract infection/probable pyonephrosis.  PROCEDURE: Cystourethroscopy under anesthesia.  SURGEON: Marin OlpJay H. Meloni Hinz, MD   ANESTHESIA: General with LMA.  ESTIMATED BLOOD LOSS: Minimal.  COMPLICATIONS: None.  DRAINS: 24 French three-way Foley catheter to straight drainage with normal saline continuous bladder irrigation.  PATHOLOGY/SPECIMEN: None.  PROCEDURE: The patient was brought to the Cystoscopy Suite and after general anesthesia was established was placed in the dorsal lithotomy position and prepped and draped in the usual sterile fashion. The 21 French cystoscope was then inserted with the 30 degree lens. Pan cystoscopy was then performed with both the 30 and 70 degree lenses. The urethra was smooth without lesion or abnormality. Within the prostate, there was occlusive bilobar prostatic enlargement with elevation and nodular irregularity of the bladder neck extending into the trigone consistent with locally advanced adenocarcinoma of the prostate. Within the bladder, again there was nodular irregular and distortion of the trigone preventing identification of either ureteral orifice. Beyond the bladder base and trigone, the mucosa was smooth without abnormality or lesion. There were no intravesical stones. There was a small bladder diverticulum on the left posterior inferior aspect of the bladder wall. There was mild oozing from the nodular irregularity at the level of the bladder neck. Intravenous indigo carmine was administered to facilitate identification of the ureteral orifices. There was no visible indigo carmine stained urine identifiable anywhere within the  bladder. As such, the bladder was left full and the cystoscope was withdrawn. A 24 French three-way Foley catheter was then inserted per urethra with the balloon inflated with 30 mL of sterile water. The effluent was light pink. The catheter was placed to straight drainage with normal saline continuous bladder irrigation.   The patient was then taken out of the dorsal lithotomy position and extubated and transferred to the post-anesthesia care unit in stable condition having tolerated the procedure well. The plan at this point is to contact interventional radiology for left percutaneous nephrostomy tube placement.  ____________________________ Marin OlpJay H. Starlet Gallentine, MD jhk:drc D: 12/13/2010 14:33:20 ET Griffin: 12/13/2010 14:54:58 ET JOB#: [id number]  cc: Marin OlpJay H. Greidy Sherard, MD, <Dictator> Marin OlpJAY H Gerell Fortson MD ELECTRONICALLY SIGNED 04/23/2011 13:28

## 2014-06-25 NOTE — H&P (Signed)
PATIENT NAME:  Russell Griffin, Russell Griffin MR#:  [id number] DATE OF BIRTH:  12-Feb-1931  DATE OF ADMISSION:  12/24/2011  PRIMARY CARE PHYSICIAN: Bobbye RiggsSylvia Mand, FNP ER PHYSICIAN: Dr. Marilynne HalstedBoland ADMITTING PHYSICIAN: Dr. Tilda FrancoAkuneme  PRESENTING COMPLAINT: Chest pain, left sided.   HISTORY OF PRESENT ILLNESS: Patient is a 81 year old male who was in his usual state of health until this evening when he had sudden onset of left-sided chest pain. Pain occurred at rest, intensity 10/10. It was pleuritic in nature, worsened by deep inspiration, nonradiating, associated with nausea but no vomiting, no loss of consciousness. Denies any PND, orthopnea. Had some pedal edema a few weeks ago which has now resolved. No recent long distance travel, sick contacts, or change in medication. Patient is usually very inactive at home, only has limited activity with walking from bathroom to his recliner. Denies any sick contacts at home. For this he was sent to the Emergency Room where he was noted to have tachycardia with heart rate of 113 and elevated blood pressure of 170 systolic on arrival, however, with medication blood pressure has come down but patient still remains tachycardic. Workup has included a d-dimer which was elevated at 1.58. For this he was referred to the hospitalist for further evaluation.   REVIEW OF SYSTEMS: CONSTITUTIONAL: Positive for fatigue. No weight loss or weight gain. EYES: No blurred vision, redness, discharge. ENT: No tinnitus, epistaxis, difficulty swallowing. RESPIRATORY: Denies any cough or wheezing but admits to painful respiration with deep inspiration. CARDIOVASCULAR: Has chest pain, pleuritic in nature. Denies any arrhythmia, palpitations, syncopal episode. GASTROINTESTINAL: No nausea, vomiting, diarrhea, abdominal pain, change in bowel habits. GENITOURINARY: Has no dysuria, frequency, incontinence. HEMATOLOGIC: No anemia, easy bruising or bleeding or swollen glands. SKIN: No rashes, change in hair or skin  texture. MUSCULOSKELETAL: No joint pain, redness or swelling. NEUROLOGIC: No numbness, headache, migraine or vertigo. PSYCH: No anxiety or depression.   PAST MEDICAL HISTORY:  1. History of hypertension. 2. Chronic kidney disease. 3. Chronic anemia. 4. History of shingles. 5. Chronic obstructive pulmonary disease. 6. Recurrent urinary tract infections.  7. 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 8. Bilateral ureteric stents and nephrostomy tubes.   SOCIAL HISTORY: Lives at home with his family. Reformed smoker, quit two years ago. Retired Midwifebutcher by profession.   FAMILY HISTORY: Positive for asthma only. Denies any family history of coronary artery disease, CVA, hypertension or diabetes.   ALLERGIES: OxyContin which gives him nausea and vomiting.   MEDICATIONS:  1. Prednisone 5 mg daily. 2. Meloxicam 50 mg daily.  3. Percocet 5/325, 1 tablet q.4 p.r.n.  4. Procardia XL 60 mg daily.  5. Zytiga 250 mg 4 tablets daily.   PHYSICAL EXAMINATION:  VITAL SIGNS: Temperature 98.7, pulse on arrival 114 now is 112, respiratory rate 20, blood pressure on arrival 170/93 now is 137/85, sats are 100 on room air.   GENERAL: Elderly gentleman sitting up on the gurney, awake, alert, oriented to time, place, and person, in no overt distress. Family at bedside, supportive.   HEENT: Atraumatic, normocephalic. Pupils equal, reactive to light and accommodation. Extraocular movement intact. Mucous membranes pink, moist.   NECK: Supple. No JV distention.   CHEST: Good air entry. Clear to auscultation. No chest wall tenderness.   HEART: Regular rate, rhythm. No murmur.   ABDOMEN: Full, moves with respiration, nontender. Bowel sounds normoactive. No organomegaly. Bilateral nephrostomy tubes noted with clean dressing over the nephrostomy tubes. Cloudy urine noted in  the urine bag.    EXTREMITIES: No edema, clubbing, deformity.    NEUROLOGICAL: No focal deficits.   PSYCH: Affect appropriate to situation.   LABORATORY, DIAGNOSTIC, AND RADIOLOGICAL DATA: EKG shows sinus tachycardia rate of 113. No acute ST wave changes. Chest x-ray no obvious infiltrates. CBC: White count 6.8, hemoglobin 9.3, platelets 272. Chemistry: Sodium 138, potassium 4.9, bicarbonate 15, anion gap 16, BUN 47, creatinine 2.7 from baseline of 2.9, glucose 107, calcium 9.2, alkaline phosphatase elevated at 313 from 255. AST 39 from 33 two weeks ago. CK 97. Troponin negative. INR 1. D-dimer 1.58. 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   IMPRESSION:  1. Pleuritic chest pain concerning for possible PE in view of tachycardia and pain with elevated Ddimer. 2. Metabolic acidosis secondary to urinary loss from the nephrostomy tubes.  3. Hypertension, stable.  4. Chronic kidney disease, stable.  5. Chronic obstructive pulmonary disease, stable. 6. Prostate cancer hormone refractory with possible bony mets noted.  7. Elevated d-dimer.   PLAN: Admit to general medical floor under observation. V/Q scan in a.m. Heparin infusion at this time. GI prophylaxis with Protonix. Deep vein thrombosis prophylaxis with heparin infusion, heparin drip. Respiratory support p.r.n. aspirin, nitroglycerin, p.r.n. morphine. Resume rest of outpatient medications, adjust as needed.  CODE STATUS: FULL CODE.       TOTAL PATIENT CARE TIME: 50 minutes.  ____________________________ Floy Sabina Tilda Franco, MD mia:cms D: 12/24/2011 03:24:27 ET Griffin: 12/24/2011 09:53:08 ET JOB#: [id number]  cc: Mekayla Soman I. Tilda Franco, MD, <Dictator> Bobbye Riggs, FNP Margaret Pyle MD ELECTRONICALLY SIGNED 12/25/2011 0:30

## 2014-06-25 NOTE — Consult Note (Signed)
Brief Consult Note: Diagnosis: likely acute cholecystitis.   Patient was seen by consultant.   Consult note dictated.   Recommend to proceed with surgery or procedure.   Orders entered.   Comments: Will get RUQ U/S today so he can have a lap CCY tomorrow if Dx confirmed, since his heparin just D/C-ed (will get more coags). Expanded ABx. Mag citrate for constipation.  Electronic Signatures: Claude MangesMarterre, Rhyker Silversmith F (MD)  (Signed 19-Oct-13 12:44)  Authored: Brief Consult Note   Last Updated: 19-Oct-13 12:44 by Claude MangesMarterre, Chelli Yerkes F (MD)

## 2014-06-25 NOTE — Consult Note (Signed)
patient was discharged home yesterday.  Repeat serum PSA during hospitalization is abnormal at 69.7.  Bone scan shows multiple areas of bone metastasis.  Have called [facility], unable to reach Dr. Verl BangsGodley, but have discussed with oncology fellow Dr. Regino SchultzeWang today who states [facility] last PSA was around 31 in August.  Given this, patient likely has progression of prostate cancer, Dr. Regino SchultzeWang is going to make an appointment for patient to see Dr. Verl BangsGodley as soon as possible for further treatment planning. Will fax over PSA/lab results along with bone scan report to Dr. Verl BangsGodley also.  Patient will be notified of above details.  Electronic Signatures: Izola PricePandit, Alawna Graybeal Raj (MD)  (Signed on 22-Oct-13 11:44)  Authored  Last Updated: 22-Oct-13 11:44 by Izola PricePandit, Eagle Pitta Raj (MD)

## 2014-06-25 NOTE — Consult Note (Addendum)
PATIENT NAME:  Russell Griffin, Russell Griffin MR#:  [id number] DATE OF BIRTH:  [birth year]  DATE OF CONSULTATION:  12/12/2010  REFERRING PHYSICIAN:  Enedina Finner, MD  CONSULTING PHYSICIAN:  Marin Olp, MD  PRIMARY CARE PHYSICIAN: Rolm Gala, MD, [facility]   REASON FOR CONSULTATION: Left hydronephrosis with obstructing left distal ureterovesical junction stone.   HISTORY OF PRESENT ILLNESS: The patient is a 81 year old black male with a history of ureterolithiasis and hormone refractory adenocarcinoma of the prostate. The patient was in his usual state of health until approximately 4 a.m. this morning when he developed the acute onset of severe left upper quadrant abdominal pain radiating back to the left flank region. The pain was constant and not colicky and did not abate until the patient presented to the Emergency Department at [facility] at approximately 7 a.m. and was evaluated and treated for his pain. There was an episode of nausea with mild emesis prior to arrival to the Emergency Department. The patient also reports an episode of gross initial hematuria (red wine in coloration but without clots) which cleared as the stream progressed. The patient denies any dysuria or frequency or urgency. The patient also denies any fever or chills. The patient presented to the [facility] Emergency Department where a stone protocol CT scan was obtained which revealed moderate to severe left hydroureteronephrosis down to the level of the bladder where an obstructing 2 mm ureterovesical junction stone was appreciated. The patient experienced difficulty with pain control in the Emergency Department and had evidence for potential urinary tract infection with a leukocytosis of 13,000 and a mild tachycardia. The patient was, therefore, admitted to the hospitalist service for pain control and initiation of antibiotic therapy.   The patient's past urologic history is notable for  hormone refractory adenocarcinoma of the prostate. The patient reports having been treated five years ago at [facility] by Dr. Kevin Fenton at the Division of Urology. Five years ago the patient underwent what sounds like external beam radiation therapy with antigen deprivation for approximately six months. The patient reports that approximately six months ago he was started on chemotherapy which was completed approximately two months ago at which time he was initiated on the new 17 lidase inhibitor, Aviadarone, Zytiga. His next follow-up with Dr. Sinclair Grooms is on the 18th of this month. The patient also relates that he had a right percutaneous nephrostomy tube placed approximately three years ago for what sounds like ureteral obstruction due to local advancement of his prostate cancer as well as a stone which the patient said was extracted. Initially the right percutaneous nephrostomy tube was changed every three months. However, approximately three months ago the patient underwent left percutaneous nephrostomy tube placement for what he reports is infection of the right percutaneous nephrostomy tube because "it was left in too long". The left percutaneous nephrostomy tube was left in place for two weeks and then was removed again approximately three months ago. The patient reports that he believes that the right percutaneous nephrostomy tube was exchanged at that time, however, this was not performed and is scheduled to be done on the 16th of this month at [facility].   PAST MEDICAL HISTORY: 1. Hypertension. 2. Emphysema. 3. Chronic renal insufficiency (baseline creatinine between 3.67 in November 2011 and 4.3 in April of 2012). 4. Hormone refractory adenocarcinoma of the prostate. 5. History of ureterolithiasis. 6. History of urinary tract infection. 7. Chronic anemia. 8. History of shingles.  PAST  SURGICAL HISTORY: Status post right percutaneous nephrostomy tube placement.  FAMILY HISTORY:  Asthma in his mother.   DRUG ALLERGIES: The patient reports a sensitivity to OxyContin.  MEDICATIONS: Procardia XL 60 mg p.o. daily.  SOCIAL HISTORY: The patient reports smoking cessation two years ago and denies any alcohol use.   REVIEW OF SYSTEMS: As per the history of present illness with the remaining review of systems as per listed in the admission history and physical.  PHYSICAL EXAMINATION:  GENERAL: Well developed well nourished African American male in no apparent distress, pleasant and cooperative, alert and oriented x4.   HEENT: Normocephalic and atraumatic. Anicteric.   NECK: No masses or bruits.   CHEST: Clear to auscultation with normal respiratory effort.   HEART: Regular rhythm with mild tachycardia without murmurs, gallops, or rubs.   ABDOMEN: Soft, nontender abdomen with normoactive bowel sounds and mild to moderate left costovertebral angle tenderness. Reducible nontender right indirect inguinal hernia.  GENITOURINARY: Normal uncircumcised phallus with bilaterally descended atrophic testes without masses and nontender.  EXTREMITIES: No lower extremity edema.  DIAGNOSTIC AND RADIOLOGICAL DATA: Sodium 142, potassium 4.6, chloride 113, CO2 17, glucose 109, BUN 41, creatinine 2.94 with estimated GFR of 27, serum calcium 8.7, white blood cell count 13.4 thousand, hematocrit 32.9, platelet count 238,000. Urinalysis pH 5.0, specific gravity 1.009, 3+ leukocyte esterase with 3+ blood and positive nitrites. Microscopic evaluation reveals 48 red blood cells with 40 white blood cells with clumps and 1+ bacteria. Stone CT evaluation reveals moderate to severe left hydroureteronephrosis with an obstructing 2 mm stone in the left ureterovesical junction as well as a right percutaneous nephrostomy tube and a decompressed right collecting system. Nonspecific bladder wall thickening is also observed.   IMPRESSION: 1. Obstructing 2 mm left ureterovesical junction stone with moderate  to severe hydronephrosis. A 2 mm stone has a greater than 95% probability of spontaneous passage, however, the patient has associated urinary tract infection. 2. Urinary tract infection. The patient has a mild leukocytosis with a urinalysis consistent with infection. However, the patient also has a chronic right percutaneous nephrostomy tube in place and clinically is not displaying any signs or symptoms of impending sepsis. Of some concern is the mild tachycardia and the mild leukocytosis; however, the patient appears remarkably well and is in no apparent distress or discomfort. The possible need for left ureteral stenting should his tachycardia persist or progress or should he develop fevers or worsening of his leukocytosis was discussed at length with the patient. The possible need for percutaneous nephrostomy tube placement on the left was also discussed should retrograde access be unobtainable due to the patient's prior history of prostate cancer treated with radiation and that with hormone refractory recurrence.   RECOMMENDATIONS: 1. Agree with IV hydration and intravenous pain management. 2. Strain all urine. 3. Agree with close monitoring of the vital signs. 4. Follow-up CBC and chemistries in the morning. 5. Bladder scan postvoid residual to rule out incomplete bladder emptying.  Thank you very much for the opportunity to participate in the care of this most pleasant gentleman.   ____________________________ Marin OlpJay H. Somya Jauregui, MD jhk:drc D: 12/12/2010 20:16:56 ET T: 12/13/2010 11:36:59 ET JOB#: [id number]  cc: Marin OlpJay H. Debara Kamphuis, MD, <Dictator> Letitia CaulHeidi M. Grandis, MD Marin OlpJAY H Jamare Vanatta MD ELECTRONICALLY SIGNED 04/23/2011 13:28
